# Patient Record
Sex: Female | Born: 1995 | Race: White | Hispanic: No | Marital: Single | Smoking: Never smoker
Health system: Southern US, Community
[De-identification: ages and names within clinical notes are randomized; demographics above are authoritative.]

## PROBLEM LIST (undated history)

## (undated) DIAGNOSIS — F32A Depression, unspecified: Secondary | ICD-10-CM

## (undated) DIAGNOSIS — S52332P Displaced oblique fracture of shaft of left radius, subsequent encounter for closed fracture with malunion: Secondary | ICD-10-CM

## (undated) DIAGNOSIS — S52309A Unspecified fracture of shaft of unspecified radius, initial encounter for closed fracture: Secondary | ICD-10-CM

## (undated) DIAGNOSIS — F329 Major depressive disorder, single episode, unspecified: Secondary | ICD-10-CM

## (undated) DIAGNOSIS — F909 Attention-deficit hyperactivity disorder, unspecified type: Secondary | ICD-10-CM

## (undated) DIAGNOSIS — F988 Other specified behavioral and emotional disorders with onset usually occurring in childhood and adolescence: Secondary | ICD-10-CM

---
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
## 2016-01-20 ENCOUNTER — Encounter: Payer: Self-pay | Admitting: Emergency Medicine

## 2016-01-20 ENCOUNTER — Ambulatory Visit
Admission: EM | Admit: 2016-01-20 | Discharge: 2016-01-20 | Disposition: A | Discharge status: Home / Self Care | Payer: Medicaid Other | Attending: Internal Medicine | Admitting: Internal Medicine

## 2016-01-20 ENCOUNTER — Ambulatory Visit: Payer: Medicaid Other

## 2016-01-20 DIAGNOSIS — F909 Attention-deficit hyperactivity disorder, unspecified type: Secondary | ICD-10-CM | POA: Insufficient documentation

## 2016-01-20 DIAGNOSIS — R05 Cough: Secondary | ICD-10-CM | POA: Diagnosis not present

## 2016-01-20 DIAGNOSIS — F329 Major depressive disorder, single episode, unspecified: Secondary | ICD-10-CM | POA: Insufficient documentation

## 2016-01-20 DIAGNOSIS — S99922A Unspecified injury of left foot, initial encounter: Secondary | ICD-10-CM | POA: Insufficient documentation

## 2016-01-20 DIAGNOSIS — X58XXXA Exposure to other specified factors, initial encounter: Secondary | ICD-10-CM | POA: Insufficient documentation

## 2016-01-20 DIAGNOSIS — R059 Cough, unspecified: Secondary | ICD-10-CM

## 2016-01-20 DIAGNOSIS — M79675 Pain in left toe(s): Secondary | ICD-10-CM | POA: Diagnosis present

## 2016-01-20 MED ORDER — BENZONATATE 100 MG PO CAPS
100.0000 mg | ORAL_CAPSULE | Freq: Three times a day (TID) | ORAL | Status: DC
Start: 2016-01-20 — End: 2019-04-07

## 2016-01-20 MED ORDER — AZITHROMYCIN 250 MG PO TABS
250.0000 mg | ORAL_TABLET | Freq: Every day | ORAL | Status: DC
Start: 2016-01-20 — End: 2019-04-07

## 2016-01-20 NOTE — ED Notes (Signed)
Patient states that she hurt her left 5th toe about 2 weeks ago.  Patient also reports cough and chest congestion for 2 months. Patient reports chills.

## 2016-01-20 NOTE — ED Provider Notes (Signed)
CSN: [id number]     Arrival date & time 01/20/16  1252 History   First MD Initiated Contact with Patient 01/20/16 1324     Chief Complaint  Patient presents with  . Toe Pain  . Cough   (Consider location/radiation/quality/duration/timing/severity/associated sxs/prior Treatment) HPI History obtained from patient: Location:left pinky toe   Context/Duration: Struck toe 2 weeks ago, when bug ran across floor  Severity:2   Quality:ache Timing:    constant        Home Treatment: cold packs Associated symptoms:  cough Family History: mother DM Social History: non smoker, no ETOH History obtained from patient:  Pt presents with the cc of: cough Duration of symptoms:1 month Treatment prior to arrival:[facility] and [facility] but states she is no better.  Context: cough with thick green sputum.  Other symptoms include: injury to left 5th toe Pain score:0 FAMILY HISTORY: SOCIAL HISTORY:    Past Medical History  Diagnosis Date  . ADHD (attention deficit hyperactivity disorder)     no current med.  . Radial shaft fracture 08/2012    left  . Closed displaced oblique fracture of shaft of left radius with malunion 09/04/2012  . ADD (attention deficit disorder)   . ADHD (attention deficit hyperactivity disorder)   . Depression    Past Surgical History  Procedure Laterality Date  . Orif wrist fracture  09/04/2012    Procedure: OPEN REDUCTION INTERNAL FIXATION (ORIF) WRIST FRACTURE;  Surgeon: Eulas PostJoshua P Landau, MD;  Location: [facility];  Service: Orthopedics;  Laterality: Left;  REPAIR MALUNION  LEFT RADIAL SHAFT FRACTURE   Family History  Problem Relation Age of Onset  . Diabetes Mother   . Asthma Maternal Grandmother   . Diabetes Paternal Grandmother   . Hypertension Paternal Grandmother   . Heart disease Paternal Grandmother   . Kidney disease Paternal Grandmother     kidney transplant   Social History  Substance Use Topics  . Smoking status:  Never Smoker   . Smokeless tobacco: None  . Alcohol Use: No   OB History    Gravida Para Term Preterm AB TAB SAB Ectopic Multiple Living   0 0 0 0 0 0 0 0       Review of Systems ROS +'ve toe injury, cough for a month  Denies: HEADACHE, NAUSEA, ABDOMINAL PAIN, CHEST PAIN, CONGESTION, DYSURIA, SHORTNESS OF BREATH  Allergies  Bee venom; Bee venom; Amoxapine and related; and Amoxicillin  Home Medications   Prior to Admission medications   Medication Sig Start Date End Date Taking? Authorizing Provider  brompheniramine-pseudoephedrine-DM 30-2-10 MG/5ML syrup Take 5 mLs by mouth 4 (four) times daily as needed. 11/19/15   Joni Reiningonald K Smith, PA-C  cetirizine (ZYRTEC) 10 MG chewable tablet Chew 1 tablet (10 mg total) by mouth daily. 05/22/12 06/05/12  Jimmie MollyPaolo Coll, MD  chlorhexidine (PERIDEX) 0.12 % solution Swish 3 tsp for 15 mins & spit tid prn mouth pain 11/02/14   Viviano SimasLauren Robinson, NP  guanFACINE (INTUNIV) 2 MG TB24 Take 1 tablet (2 mg total) by mouth every morning. 01/28/12   Nelly RoutArchana Kumar, MD  lisdexamfetamine (VYVANSE) 40 MG capsule Take 1 capsule (40 mg total) by mouth every morning. 01/28/12   Nelly RoutArchana Kumar, MD  mometasone (ELOCON) 0.1 % cream Apply 1 application topically daily. 10/05/13   Linna HoffJames D Kindl, MD   Meds Ordered and Administered this Visit  Medications - No data to display  BP 105/68 mmHg  Pulse 69  Temp(Src) 97.7  F (36.5 C) (Tympanic)  Resp 16  Ht  (1.702 m)  Wt 122 lb (55.339 kg)  BMI 19.10 kg/m2  SpO2 99% No data found.   Physical Exam NURSES NOTES AND VITAL SIGNS REVIEWED. CONSTITUTIONAL: Well developed, well nourished, no acute distress HEENT: normocephalic, atraumatic EYES: Conjunctiva normal NECK:normal ROM, supple, no adenopathy PULMONARY:No respiratory distress, normal effort ABDOMINAL: Soft, ND, NT BS+, No CVAT MUSCULOSKELETAL: Normal ROM of all extremities,  5th toe, left foot. No bruise, tender to palpation. No visible deformity SKIN: warm and dry  without rash PSYCHIATRIC: Mood and affect, behavior are normal  ED Course  Procedures (including critical care time)  Labs Review Labs Reviewed - No data to display  Imaging Review No results found. I HAVE PERSONALLY  REVIEWED AND DISCUSSED RESULTS OF  X-RAYS WITH PATIENT  PRIOR TO DISCHARGE.      Visual Acuity Review  Right Eye Distance:   Left Eye Distance:   Bilateral Distance:    Right Eye Near:   Left Eye Near:    Bilateral Near:         MDM   1. Toe injury, left, initial encounter   2. Cough   new old   OLD RECORDS HAVE BEEN REVIEWED.    Tharon Aquas, PA 01/20/16 1539

## 2016-01-20 NOTE — Discharge Instructions (Signed)

## 2016-04-10 ENCOUNTER — Other Ambulatory Visit: Payer: Self-pay | Admitting: Internal Medicine

## 2016-04-10 DIAGNOSIS — Q510 Agenesis and aplasia of uterus: Secondary | ICD-10-CM

## 2016-04-10 DIAGNOSIS — N910 Primary amenorrhea: Secondary | ICD-10-CM

## 2016-04-13 ENCOUNTER — Ambulatory Visit
Admission: RE | Admit: 2016-04-13 | Discharge: 2016-04-13 | Disposition: A | Discharge status: Home / Self Care | Payer: Medicaid Other | Source: Ambulatory Visit | Attending: Internal Medicine | Admitting: Internal Medicine

## 2016-04-13 DIAGNOSIS — N910 Primary amenorrhea: Secondary | ICD-10-CM | POA: Diagnosis present

## 2016-04-13 DIAGNOSIS — Q510 Agenesis and aplasia of uterus: Secondary | ICD-10-CM | POA: Diagnosis not present

## 2016-07-04 ENCOUNTER — Other Ambulatory Visit: Payer: Self-pay | Admitting: Medical Oncology

## 2016-07-04 DIAGNOSIS — R110 Nausea: Secondary | ICD-10-CM

## 2016-07-04 DIAGNOSIS — R1013 Epigastric pain: Secondary | ICD-10-CM

## 2016-07-09 ENCOUNTER — Ambulatory Visit: Payer: Medicaid Other

## 2017-04-29 ENCOUNTER — Other Ambulatory Visit: Payer: Self-pay | Admitting: Medical Oncology

## 2017-04-29 DIAGNOSIS — E041 Nontoxic single thyroid nodule: Secondary | ICD-10-CM

## 2017-05-06 ENCOUNTER — Ambulatory Visit
Admission: RE | Admit: 2017-05-06 | Discharge: 2017-05-06 | Disposition: A | Discharge status: Home / Self Care | Payer: Medicaid Other | Source: Ambulatory Visit | Attending: Medical Oncology | Admitting: Medical Oncology

## 2017-05-06 ENCOUNTER — Ambulatory Visit: Payer: Medicaid Other

## 2017-05-10 ENCOUNTER — Ambulatory Visit: Payer: Medicaid Other

## 2017-05-15 ENCOUNTER — Ambulatory Visit: Admission: RE | Admit: 2017-05-15 | Payer: Medicaid Other | Source: Ambulatory Visit

## 2017-08-28 ENCOUNTER — Emergency Department
Admission: EM | Admit: 2017-08-28 | Discharge: 2017-08-28 | Disposition: A | Discharge status: Home / Self Care | Payer: Medicaid Other | Attending: Emergency Medicine | Admitting: Emergency Medicine

## 2017-08-28 ENCOUNTER — Encounter: Payer: Self-pay | Admitting: Emergency Medicine

## 2017-08-28 ENCOUNTER — Emergency Department: Payer: Medicaid Other

## 2017-08-28 ENCOUNTER — Other Ambulatory Visit: Payer: Self-pay

## 2017-08-28 DIAGNOSIS — Z79899 Other long term (current) drug therapy: Secondary | ICD-10-CM | POA: Diagnosis not present

## 2017-08-28 DIAGNOSIS — M545 Low back pain, unspecified: Secondary | ICD-10-CM

## 2017-08-28 DIAGNOSIS — M542 Cervicalgia: Secondary | ICD-10-CM | POA: Insufficient documentation

## 2017-08-28 MED ORDER — DIAZEPAM 2 MG PO TABS: 2.0000 mg | ORAL_TABLET | Freq: Four times a day (QID) | ORAL | 0 refills | Status: DC | PRN

## 2017-08-28 MED ORDER — ETODOLAC 200 MG PO CAPS: 200.0000 mg | ORAL_CAPSULE | Freq: Three times a day (TID) | ORAL | 0 refills | Status: DC

## 2017-08-28 MED ORDER — KETOROLAC TROMETHAMINE 60 MG/2ML IM SOLN
60.0000 mg | Freq: Once | INTRAMUSCULAR | Status: DC
  Filled 2017-08-28: qty 2

## 2017-08-28 MED ORDER — LIDOCAINE 5 % EX PTCH: 1.0000 | MEDICATED_PATCH | Freq: Two times a day (BID) | CUTANEOUS | 0 refills | Status: AC

## 2017-08-28 MED ORDER — OXYCODONE-ACETAMINOPHEN 5-325 MG PO TABS
2.0000 | ORAL_TABLET | Freq: Once | ORAL | Status: AC
  Administered 2017-08-28: 2 via ORAL
  Filled 2017-08-28: qty 2
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
## 2017-08-28 NOTE — ED Notes (Signed)
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 MD notified.

## 2017-08-28 NOTE — Discharge Instructions (Signed)
Please follow up with your primary care physician.

## 2017-08-28 NOTE — ED Notes (Signed)
Patient transported to X-ray 
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
## 2017-08-28 NOTE — ED Notes (Signed)
Pt went to Imaging.

## 2019-04-07 ENCOUNTER — Emergency Department
Admission: EM | Admit: 2019-04-07 | Discharge: 2019-04-07 | Disposition: A | Discharge status: Home / Self Care | Payer: HRSA Program | Attending: Emergency Medicine | Admitting: Emergency Medicine

## 2019-04-07 ENCOUNTER — Other Ambulatory Visit: Payer: Self-pay

## 2019-04-07 ENCOUNTER — Encounter: Payer: Self-pay | Admitting: Emergency Medicine

## 2019-04-07 DIAGNOSIS — R110 Nausea: Secondary | ICD-10-CM | POA: Diagnosis not present

## 2019-04-07 DIAGNOSIS — Z20828 Contact with and (suspected) exposure to other viral communicable diseases: Secondary | ICD-10-CM | POA: Diagnosis not present

## 2019-04-07 DIAGNOSIS — R0981 Nasal congestion: Secondary | ICD-10-CM | POA: Insufficient documentation

## 2019-04-07 DIAGNOSIS — M791 Myalgia, unspecified site: Secondary | ICD-10-CM | POA: Insufficient documentation

## 2019-04-07 DIAGNOSIS — R05 Cough: Secondary | ICD-10-CM | POA: Insufficient documentation

## 2019-04-07 DIAGNOSIS — R51 Headache: Secondary | ICD-10-CM | POA: Diagnosis present

## 2019-04-07 DIAGNOSIS — Z20822 Contact with and (suspected) exposure to covid-19: Secondary | ICD-10-CM

## 2019-04-07 DIAGNOSIS — A64 Unspecified sexually transmitted disease: Secondary | ICD-10-CM | POA: Diagnosis not present

## 2019-04-07 LAB — CHLAMYDIA/NGC RT PCR (ARMC ONLY): N gonorrhoeae: NOT DETECTED

## 2019-04-07 LAB — WET PREP, GENITAL
Clue Cells Wet Prep HPF POC: NONE SEEN
Sperm: NONE SEEN
Trich, Wet Prep: NONE SEEN
Yeast Wet Prep HPF POC: NONE SEEN

## 2019-04-07 LAB — CHLAMYDIA/NGC RT PCR (ARMC ONLY)??????????: Chlamydia Tr: NOT DETECTED

## 2019-04-07 MED ORDER — AZITHROMYCIN 500 MG PO TABS
1000.0000 mg | ORAL_TABLET | Freq: Once | ORAL | Status: AC
  Administered 2019-04-07: 16:00:00 1000 mg via ORAL
  Filled 2019-04-07: qty 2

## 2019-04-07 MED ORDER — CEFTRIAXONE SODIUM 250 MG IJ SOLR
250.0000 mg | Freq: Once | INTRAMUSCULAR | Status: AC
  Administered 2019-04-07: 250 mg via INTRAMUSCULAR
  Filled 2019-04-07: qty 250

## 2019-04-07 NOTE — ED Notes (Signed)
See triage note  States she has has h/a and cough for about 2 days  Denies any fever  But has had some nausea  Afebrile on arrival   Also states she was treated for possible STD  But states conts to have some discharge and vaginal itching

## 2019-04-07 NOTE — ED Provider Notes (Signed)
[facility] Emergency Department Provider Note  ____________________________________________  Time seen: Approximately 3:34 PM  I have reviewed the triage vital signs and the nursing notes.   HISTORY  Chief Complaint No chief complaint on file.    HPI Jillian Ross is a 23 y.o. female who presents the emergency department for 2 complaints.  Patient's primary complaint is requesting COVID testing as she has had headaches, nasal congestion, cough, nausea, body aches for 3 to 4 days.  She denies any fever, shortness of breath, emesis, diarrhea or constipation.  Patient reports that she lives next to her grandmother and is very concerned given the close contact for any transmission to her elderly relative.  Patient reports that when symptoms began she has self quarantine.  She does not know any specific contact with a positive COVID-19 person.  Patient is also requesting STD testing.  Patient reports that she was treated by her primary care 2 weeks prior for a chlamydial infection.  According to the patient, she had been placed on doxycycline, Diflucan.  Patient is still symptomatic this time.  She denies any dysuria or polyuria.  No hematuria.        Past Medical History:  Diagnosis Date  . ADD (attention deficit disorder)   . ADHD (attention deficit hyperactivity disorder)    no current med.  . ADHD (attention deficit hyperactivity disorder)   . Closed displaced oblique fracture of shaft of left radius with malunion 09/04/2012  . Depression   . Radial shaft fracture 08/2012   left    Patient Active Problem List   Diagnosis Date Noted  . Closed displaced oblique fracture of shaft of left radius with malunion 09/04/2012  . ADHD (attention deficit hyperactivity disorder), combined type 08/30/2011  . ODD (oppositional defiant disorder) 08/30/2011    Past Surgical History:  Procedure Laterality Date  . ORIF WRIST FRACTURE  09/04/2012   Procedure:  OPEN REDUCTION INTERNAL FIXATION (ORIF) WRIST FRACTURE;  Surgeon: Johnny Bridge, MD;  Location: [facility];  Service: Orthopedics;  Laterality: Left;  REPAIR MALUNION  LEFT RADIAL SHAFT FRACTURE    Prior to Admission medications   Medication Sig Start Date End Date Taking? Authorizing Provider  cetirizine (ZYRTEC) 10 MG chewable tablet Chew 1 tablet (10 mg total) by mouth daily. 05/22/12 06/05/12  Rosana Hoes, MD  guanFACINE (INTUNIV) 2 MG TB24 Take 1 tablet (2 mg total) by mouth every morning. 01/28/12   Hampton Abbot, MD  lisdexamfetamine (VYVANSE) 40 MG capsule Take 1 capsule (40 mg total) by mouth every morning. 01/28/12   Hampton Abbot, MD  mometasone (ELOCON) 0.1 % cream Apply 1 application topically daily. 10/05/13   Billy Fischer, MD    Allergies Bee venom, Bee venom, Amoxapine and related, and Amoxicillin  Family History  Problem Relation Age of Onset  . Diabetes Mother   . Asthma Maternal Grandmother   . Diabetes Paternal Grandmother   . Hypertension Paternal Grandmother   . Heart disease Paternal Grandmother   . Kidney disease Paternal Grandmother        kidney transplant    Social History Social History   Tobacco Use  . Smoking status: Never Smoker  . Smokeless tobacco: Never Used  Substance Use Topics  . Alcohol use: Yes  . Drug use: No     Review of Systems  Constitutional: No fever/chills.  Positive for body aches. Eyes: No visual changes. No discharge ENT: N positive for nasal congestion. Cardiovascular: no chest  pain. Respiratory: Positive cough. No SOB. Gastrointestinal: No abdominal pain.  Positive no vomiting.  No diarrhea.  No constipation. Genitourinary: Negative for dysuria. No hematuria.  Positive for vaginal discharge.  Recently treated for chlamydia Musculoskeletal: Negative for musculoskeletal pain. Skin: Negative for rash, abrasions, lacerations, ecchymosis. Neurological: Negative for headaches, focal weakness or  numbness. 10-point ROS otherwise negative.  ____________________________________________   PHYSICAL EXAM:  VITAL SIGNS: ED Triage Vitals  Enc Vitals Group     BP 04/07/19 1512 110/71     Pulse Rate 04/07/19 1512 70     Resp 04/07/19 1512 16     Temp 04/07/19 1512 98.3 F (36.8 C)     Temp Source 04/07/19 1512 Oral     SpO2 04/07/19 1512 100 %     Weight 04/07/19 1509 119 lb 14.9 oz (54.4 kg)     Height --      Head Circumference --      Peak Flow --      Pain Score 04/07/19 1509 0     Pain Loc --      Pain Edu? --      Excl. in GC? --      Constitutional: Alert and oriented. Well appearing and in no acute distress. Eyes: Conjunctivae are normal. PERRL. EOMI. Head: Atraumatic. ENT:      Ears:       Nose: No congestion/rhinnorhea.      Mouth/Throat: Mucous membranes are moist.  Oropharynx is nonerythematous and nonedematous.  Use midline. Neck: No stridor.  Neck is supple full range of motion Hematological/Lymphatic/Immunilogical: No cervical lymphadenopathy. Cardiovascular: Normal rate, regular rhythm. Normal S1 and S2.  Good peripheral circulation. Respiratory: Normal respiratory effort without tachypnea or retractions. Lungs CTAB. Good air entry to the bases with no decreased or absent breath sounds. Gastrointestinal: Bowel sounds 4 quadrants. Soft and nontender to palpation. No guarding or rigidity. No palpable masses. No distention. No CVA tenderness. Genitourinary: Visualization of the external genitalia reveal no abnormal findings.  Pelvic exam performed with no signs of vaginal lacerations or tears.  Patient does have milky white discharge in the vaginal vault.  Cervix is unremarkable.  Bimanual exam reveals no cervical motion tenderness.  No palpable masses or lesions in bilateral adnexa. Musculoskeletal: Full range of motion to all extremities. No gross deformities appreciated. Neurologic:  Normal speech and language. No gross focal neurologic deficits are  appreciated.  Skin:  Skin is warm, dry and intact. No rash noted. Psychiatric: Mood and affect are normal. Speech and behavior are normal. Patient exhibits appropriate insight and judgement.  Pelvic exam chaperoned by female RN. ____________________________________________   LABS (all labs ordered are listed, but only abnormal results are displayed)  Labs Reviewed  WET PREP, GENITAL  CHLAMYDIA/NGC RT PCR ([facility] ONLY)  NOVEL CORONAVIRUS, NAA (HOSPITAL ORDER, SEND-OUT TO REF LAB)  POC URINE PREG, ED   ____________________________________________  EKG   ____________________________________________  RADIOLOGY   No results found.  ____________________________________________    PROCEDURES  Procedure(s) performed:    Procedures    Medications  cefTRIAXone (ROCEPHIN) injection 250 mg (has no administration in time range)  azithromycin (ZITHROMAX) tablet 1,000 mg (has no administration in time range)     ____________________________________________   INITIAL IMPRESSION / ASSESSMENT AND PLAN / ED COURSE  Pertinent labs & imaging results that were available during my care of the patient were reviewed by me and considered in my medical decision making (see chart for details).  Review of the [place] CSRS was  performed in accordance of the [facility] prior to dispensing any controlled drugs.           Patient's diagnosis is consistent with COVID-19-like illness, exposure to STD.  Patient presented to emergency department with 2 complaints.  Patient has mild symptoms concerning for COVID-19.  She is concerned as she lives next to her elderly grandmother.  Patient will have testing performed at this time.  This is a send out test and she will receive results if test returns positive.  Patient is also concerned that she continues to have STD symptoms.  Patient was treated for chlamydia with doxycycline.  Patient is still symptomatic at this time.  Patient will receive  azithromycin and Rocephin injection in the emergency department.  Patient does have milky vaginal discharge at this time.  Labs are still pending, but again patient is treated empirically..  Patient may call the emergency department for results later.  Patient is given instructions regarding COVID-19 and self quarantine.  No medications prescribed at this time.  Patient is given ED precautions to return to the ED for any worsening or new symptoms.     ____________________________________________  FINAL CLINICAL IMPRESSION(S) / ED DIAGNOSES  Final diagnoses:  Suspected Covid-19 Virus Infection  STD (female)      NEW MEDICATIONS STARTED DURING THIS VISIT:  ED Discharge Orders    None          This chart was dictated using voice recognition software/Dragon. Despite best efforts to proofread, errors can occur which can change the meaning. Any change was purely unintentional.    Racheal PatchesCuthriell, Cynthia Stainback D, PA-C 04/07/19 1615    Phineas SemenGoodman, Graydon, MD 04/07/19 [phone]

## 2019-04-07 NOTE — ED Triage Notes (Signed)
C/O headache and cough for the past few days.  Denies fevers.  Patient states she lives near her grandmother and is worried she has COVID.

## 2019-04-08 LAB — NOVEL CORONAVIRUS, NAA (HOSP ORDER, SEND-OUT TO REF LAB; TAT 18-24 HRS): SARS-CoV-2, NAA: NOT DETECTED

## 2019-08-21 ENCOUNTER — Emergency Department (HOSPITAL_COMMUNITY): Payer: Medicaid Other

## 2019-08-21 ENCOUNTER — Encounter (HOSPITAL_COMMUNITY): Payer: Self-pay | Admitting: *Deleted

## 2019-08-21 ENCOUNTER — Emergency Department (HOSPITAL_COMMUNITY)
Admission: EM | Admit: 2019-08-21 | Discharge: 2019-08-21 | Disposition: A | Discharge status: Home / Self Care | Payer: Medicaid Other | Attending: Emergency Medicine | Admitting: Emergency Medicine

## 2019-08-21 ENCOUNTER — Other Ambulatory Visit: Payer: Self-pay

## 2019-08-21 DIAGNOSIS — R519 Headache, unspecified: Secondary | ICD-10-CM | POA: Insufficient documentation

## 2019-08-21 DIAGNOSIS — R0789 Other chest pain: Secondary | ICD-10-CM | POA: Insufficient documentation

## 2019-08-21 DIAGNOSIS — Z79899 Other long term (current) drug therapy: Secondary | ICD-10-CM | POA: Insufficient documentation

## 2019-08-21 DIAGNOSIS — R040 Epistaxis: Secondary | ICD-10-CM | POA: Insufficient documentation

## 2019-08-21 DIAGNOSIS — R55 Syncope and collapse: Secondary | ICD-10-CM | POA: Insufficient documentation

## 2019-08-21 LAB — CBG MONITORING, ED: Glucose-Capillary: 84 mg/dL (ref 70–99)

## 2019-08-21 LAB — BASIC METABOLIC PANEL
Anion gap: 7 (ref 5–15)
BUN: 14 mg/dL (ref 6–20)
CO2: 25 mmol/L (ref 22–32)
Calcium: 8.3 mg/dL — ABNORMAL LOW (ref 8.9–10.3)
Chloride: 107 mmol/L (ref 98–111)
Creatinine, Ser: 0.94 mg/dL (ref 0.44–1.00)
GFR calc Af Amer: >60 mL/min (ref >60)
GFR calc non Af Amer: >60 mL/min (ref >60)
Glucose, Bld: 161 mg/dL — ABNORMAL HIGH (ref 70–99)
Potassium: 3.4 mmol/L — ABNORMAL LOW (ref 3.5–5.1)
Sodium: 139 mmol/L (ref 135–145)

## 2019-08-21 LAB — CBC
HCT: 36.3 % (ref 36.0–46.0)
Hemoglobin: 12.3 g/dL (ref 12.0–15.0)
MCH: 34.7 pg — ABNORMAL HIGH (ref 26.0–34.0)
MCHC: 33.9 g/dL (ref 30.0–36.0)
MCV: 102.5 fL — ABNORMAL HIGH (ref 80.0–100.0)
Platelets: 164 10*3/uL (ref 150–400)
RBC: 3.54 MIL/uL — ABNORMAL LOW (ref 3.87–5.11)
RDW: 11.9 % (ref 11.5–15.5)
WBC: 7.6 10*3/uL (ref 4.0–10.5)
nRBC: 0 % (ref 0.0–0.2)

## 2019-08-21 LAB — I-STAT BETA HCG BLOOD, ED (MC, WL, AP ONLY): I-stat hCG, quantitative: <5 m[IU]/mL (ref <5)

## 2019-08-21 MED ORDER — SODIUM CHLORIDE 0.9% FLUSH: 3.0000 mL | Freq: Once | INTRAVENOUS | Status: DC

## 2019-08-21 MED ORDER — POTASSIUM CHLORIDE CRYS ER 20 MEQ PO TBCR: 20.0000 meq | EXTENDED_RELEASE_TABLET | Freq: Every day | ORAL | 0 refills | Status: AC

## 2019-08-21 NOTE — ED Notes (Signed)
Pt discharged by previous RN not removed from system

## 2019-08-21 NOTE — ED Notes (Signed)
Pt requested to be discharged, PA argeed and pt given d/c instructions including some results were not back. Pt escorted out with S.O

## 2019-08-21 NOTE — ED Provider Notes (Signed)
[facility] EMERGENCY DEPARTMENT Provider Note   CSN: [id number] Arrival date & time: 08/21/19  0867     History Chief Complaint  Patient presents with  . Near Syncope    Jillian Ross is a 23 y.o. female with a history of ADHD who presents to the ED s/p syncopal episode which occurred just PTA this AM. Patient states shortly after she woke up she got out of bed to go to the bathroom and as she was ambulating she began to feel lightheaded & passed out. She states she struck her face on something but is unsure what. She was only out for a few seconds and as she woke back up she felt a bit nauseous with pain to the teeth & nose with epistaxis which she thinks was coming from both nostrils. Currently bleeding has stopped, she is having pain primarily to the nose and also mildly to the chest- states pain did not start until after fall with syncope. No alleviating/aggravating factors. No prior history of syncope. Denies chest pain/dyspena/palpitations prior to syncope. Denies visual disturbance, numbness, weakness, vomiting, seizure activity, fever, dyspnea, abdominal pain, neck pain, or back pain. She is not on blood thinners.   HPI     Past Medical History:  Diagnosis Date  . ADD (attention deficit disorder)   . ADHD (attention deficit hyperactivity disorder)    no current med.  . ADHD (attention deficit hyperactivity disorder)   . Closed displaced oblique fracture of shaft of left radius with malunion 09/04/2012  . Depression   . Radial shaft fracture 08/2012   left    Patient Active Problem List   Diagnosis Date Noted  . Closed displaced oblique fracture of shaft of left radius with malunion 09/04/2012  . ADHD (attention deficit hyperactivity disorder), combined type 08/30/2011  . ODD (oppositional defiant disorder) 08/30/2011    Past Surgical History:  Procedure Laterality Date  . ORIF WRIST FRACTURE  09/04/2012   Procedure: OPEN REDUCTION INTERNAL  FIXATION (ORIF) WRIST FRACTURE;  Surgeon: Johnny Bridge, MD;  Location: [place];  Service: Orthopedics;  Laterality: Left;  REPAIR MALUNION  LEFT RADIAL SHAFT FRACTURE     OB History    Gravida  0   Para  0   Term  0   Preterm  0   AB  0   Living        SAB  0   TAB  0   Ectopic  0   Multiple      Live Births              Family History  Problem Relation Age of Onset  . Diabetes Mother   . Asthma Maternal Grandmother   . Diabetes Paternal Grandmother   . Hypertension Paternal Grandmother   . Heart disease Paternal Grandmother   . Kidney disease Paternal Grandmother        kidney transplant    Social History   Tobacco Use  . Smoking status: Never Smoker  . Smokeless tobacco: Never Used  Substance Use Topics  . Alcohol use: Yes  . Drug use: No    Home Medications Prior to Admission medications   Medication Sig Start Date End Date Taking? Authorizing Provider  cetirizine (ZYRTEC) 10 MG chewable tablet Chew 1 tablet (10 mg total) by mouth daily. 05/22/12 06/05/12  Rosana Hoes, MD  guanFACINE (INTUNIV) 2 MG TB24 Take 1 tablet (2 mg total) by mouth every morning. 01/28/12   Dwyane Dee,  Ovidio Kin, MD  lisdexamfetamine (VYVANSE) 40 MG capsule Take 1 capsule (40 mg total) by mouth every morning. 01/28/12   Nelly Rout, MD  mometasone (ELOCON) 0.1 % cream Apply 1 application topically daily. 10/05/13   Linna Hoff, MD    Allergies    Bee venom, Bee venom, Amoxapine and related, and Amoxicillin  Review of Systems   Review of Systems  Constitutional: Negative for chills and fever.  HENT: Positive for nosebleeds (resolved @ present).        Positive for nasal & dental pain.   Eyes: Negative for visual disturbance.  Respiratory: Negative for shortness of breath.   Cardiovascular: Positive for chest pain (s/p fall/syncope).  Gastrointestinal: Positive for nausea. Negative for abdominal pain, constipation, diarrhea and vomiting.  Genitourinary:  Negative for dysuria.  Musculoskeletal: Negative for back pain and neck pain.  Neurological: Positive for syncope and light-headedness (resolved@ present). Negative for seizures, facial asymmetry, weakness, numbness and headaches.  All other systems reviewed and are negative.   Physical Exam Updated Vital Signs BP 114/72 (BP Location: Right Arm)   Pulse 63   Temp 97.9 F (36.6 C) (Oral)   Resp 16   SpO2 100%   Physical Exam Vitals and nursing note reviewed.  Constitutional:      General: She is not in acute distress.    Appearance: She is well-developed.  HENT:     Head: Normocephalic. No raccoon eyes or Battle's sign.     Comments: Other than nose no facial bony tenderness.     Right Ear: No hemotympanum.     Left Ear: No hemotympanum.     Nose:     Comments: No active epistaxis, blood present in L nare.  No septal hematoma.  No significant septal deviation.  Tenderness to palpation to the nose with mild swelling present.     Mouth/Throat:     Pharynx: Uvula midline. No oropharyngeal exudate or posterior oropharyngeal erythema.     Comments: No palpable instability or tenderness to dentition.No intra-oral wounds/bleeding.  Posterior oropharynx is symmetric appearing. Patient tolerating own secretions without difficulty. No trismus. No drooling. No hot potato voice. No swelling beneath the tongue, submandibular compartment is soft.  Eyes:     General:        Right eye: No discharge.        Left eye: No discharge.     Extraocular Movements: Extraocular movements intact.     Conjunctiva/sclera: Conjunctivae normal.     Pupils: Pupils are equal, round, and reactive to light.  Cardiovascular:     Rate and Rhythm: Normal rate and regular rhythm.     Heart sounds: No murmur.  Pulmonary:     Effort: No respiratory distress.     Breath sounds: Normal breath sounds. No wheezing or rales.  Chest:     Chest wall: Tenderness (anterior chest wall to the sternum, no overlying skin  changes or palpable crepitus) present.  Abdominal:     General: There is no distension.     Palpations: Abdomen is soft.     Tenderness: There is no abdominal tenderness.  Musculoskeletal:     Cervical back: Normal range of motion and neck supple. No spinous process tenderness.     Comments: UE/LEs: Intact AROM, no point/focal bony tenderness. R ankle anterior well healed scar present consistent with prior surgery.  No midline spinal tenderness. No palpable step off.   Skin:    General: Skin is warm and dry.  Findings: No rash.  Psychiatric:        Behavior: Behavior normal.     ED Results / Procedures / Treatments   Labs (all labs ordered are listed, but only abnormal results are displayed) Labs Reviewed  BASIC METABOLIC PANEL - Abnormal; Notable for the following components:      Result Value   Potassium 3.4 (*)    Glucose, Bld 161 (*)    Calcium 8.3 (*)    All other components within normal limits  CBC - Abnormal; Notable for the following components:   RBC 3.54 (*)    MCV 102.5 (*)    MCH 34.7 (*)    All other components within normal limits  URINALYSIS, ROUTINE W REFLEX MICROSCOPIC  CBG MONITORING, ED  I-STAT BETA HCG BLOOD, ED ([facility], [facility], [facility] ONLY)    EKG EKG Interpretation  Date/Time:  Friday August 21 2019 08:28:17 EST Ventricular Rate:  55 PR Interval:  134 QRS Duration: 94 QT Interval:  414 QTC Calculation: 396 R Axis:   86 Text Interpretation: Sinus bradycardia with sinus arrhythmia RSR' or QR pattern in V1 suggests right ventricular conduction delay Nonspecific T wave abnormality Abnormal ECG No previous ECGs available Confirmed by Frederick Peers [id number]) on 08/21/2019 9:06:56 AM   Radiology No results found.  Procedures Procedures (including critical care time)  Medications Ordered in ED Medications  sodium chloride flush (NS) 0.9 % injection 3 mL (has no administration in time range)    ED Course/MDM  I have reviewed the triage vital signs  and the nursing notes.  Pertinent labs & imaging results that were available during my care of the patient were reviewed by me and considered in my medical decision making (see chart for details).  Patient presents to the ED s/p syncope episode with complaints of epistaxis which is resolved and nasal/dental/chest pain s/p injury. Patient is nontoxic appearing, no apparent distress, vitals WNL. No midline spinal tenderness or focal neuro deficits- do not suspect head bleed or spinal fracture. She does have focal tenderness to the nasal bones without active bleeding therefore nasal bone xray was ordered & pending, no other facial bony tenderness or palpable dental instability to indicate significant facial fracture. She has anterior chest wall tenderness to palpation, CXR negative, no overlying skin changes, hypoxia, respiratory distress, or hemoptysis to raise concern for more significant intrathoracic trauma. No abdominal tenderness. Labs reviewed: CBC without anemia, BMP with mild hypokalemia & mild hypocalcemia discussed diet recommendations & kdur prescription, hyperglycemia will need PCP recheck. Preg test negative- doubt ectopic. EKG & cardiac monitor without significant arrhythmia, STEMI, or substantial abnormality in intervals. Patient requesting to leave the ER prior to nasal bone xray results, urine sample or orthostatics. She is tolerating PO, vitals remain stable, she is ambulatory without difficulty. No septal hematoma, obvious deformity, or active bleeding from nose- will discharge with precautionary nasal fracture instructions (no nose blowing, ice, ENT follow up) as well as instructions for PCP follow up and good oral hydration. I discussed results, treatment plan, need for follow-up, and return precautions with the patient. Provided opportunity for questions, patient confirmed understanding and is in agreement with plan.   Findings and plan of care discussed with supervising physician Dr.  Clarene Duke who is in agreement.    Final Clinical Impression(s) / ED Diagnoses Final diagnoses:  Syncope, unspecified syncope type    Rx / DC Orders ED Discharge Orders         Ordered    potassium chloride SA (  KLOR-CON) 20 MEQ tablet  Daily     08/21/19 1023         Following discharge- nasal bone xray returned negative for fx.     Cherly Andersonetrucelli, Ryliee Figge R, PA-C 08/21/19 1034    Little, Ambrose Finlandachel Morgan, MD 08/21/19 [phone]

## 2019-08-21 NOTE — ED Triage Notes (Signed)
Patient states she was going to the bathroom this am and fell doesn't remember what happened. States she hit her nose and mouth on the floor. No loose teeth. Blood coming from nose. C/o pain in her face. Currently alert oriented. Pale.

## 2019-08-21 NOTE — ED Notes (Signed)
Patient transported to x-ray. ?

## 2019-08-21 NOTE — Discharge Instructions (Signed)
You were seen in the emergency department today after passing out. Your labs show that your potassium was mildly low, we are sending you home with a few days worth of potassium tablets to help with this, please also see attached diet guidelines. Your calcium was a bit low- please see diet recommendations. Your blood sugar was noted to be elevated at 161, please have this rechecked by your primary care provider within 1 week.  We have prescribed you new medication(s) today. Discuss the medications prescribed today with your pharmacist as they can have adverse effects and interactions with your other medicines including over the counter and prescribed medications. Seek medical evaluation if you start to experience new or abnormal symptoms after taking one of these medicines, seek care immediately if you start to experience difficulty breathing, feeling of your throat closing, facial swelling, or rash as these could be indications of a more serious allergic reaction  Your chest x-ray was normal. Your nose x-ray had not resulted at the time you asked to leave the emergency department. Given the possibility that this could be broken and we recommend that you do not blow your nose at all, apply ice wrapped in a towel 20 minutes on 40 minutes off to your nasal area for the next 48 hours, you may use nasal saline sprays, and please follow-up with the ear nose and throat doctor provided in your discharge instructions within 3 days.  Please take Tylenol/Motrin per over-the-counter dosing to help with discomfort.  Please be sure to stay well-hydrated.  Please follow-up with your primary care provider within 1 week regarding today's event. Return to the ER for new or worsening symptoms including but not limited to recurrence of passing out, seizure activity, inability to keep fluids down, worsening pain, uncontrollable nosebleeding, or any other concerns.

## 2020-05-10 ENCOUNTER — Ambulatory Visit (LOCAL_COMMUNITY_HEALTH_CENTER): Payer: Self-pay

## 2020-05-10 ENCOUNTER — Other Ambulatory Visit: Payer: Self-pay

## 2020-05-10 DIAGNOSIS — Z111 Encounter for screening for respiratory tuberculosis: Secondary | ICD-10-CM

## 2020-05-13 ENCOUNTER — Other Ambulatory Visit: Payer: Self-pay

## 2020-05-13 ENCOUNTER — Ambulatory Visit (LOCAL_COMMUNITY_HEALTH_CENTER): Payer: Medicaid Other

## 2020-05-13 DIAGNOSIS — Z111 Encounter for screening for respiratory tuberculosis: Secondary | ICD-10-CM

## 2020-05-13 LAB — TB SKIN TEST
Induration: 0 mm
TB Skin Test: NEGATIVE

## 2021-02-19 IMAGING — CR DG CHEST 2V
2 series · 2 of 2 positions shown · non-contrast
Comparison: Chest radiograph 08/28/2017

CLINICAL DATA: Injury. Additional history provided: Patient reports
that she fainted and fell and think she hit her face.

EXAM:
CHEST - 2 VIEW

[chest lat]
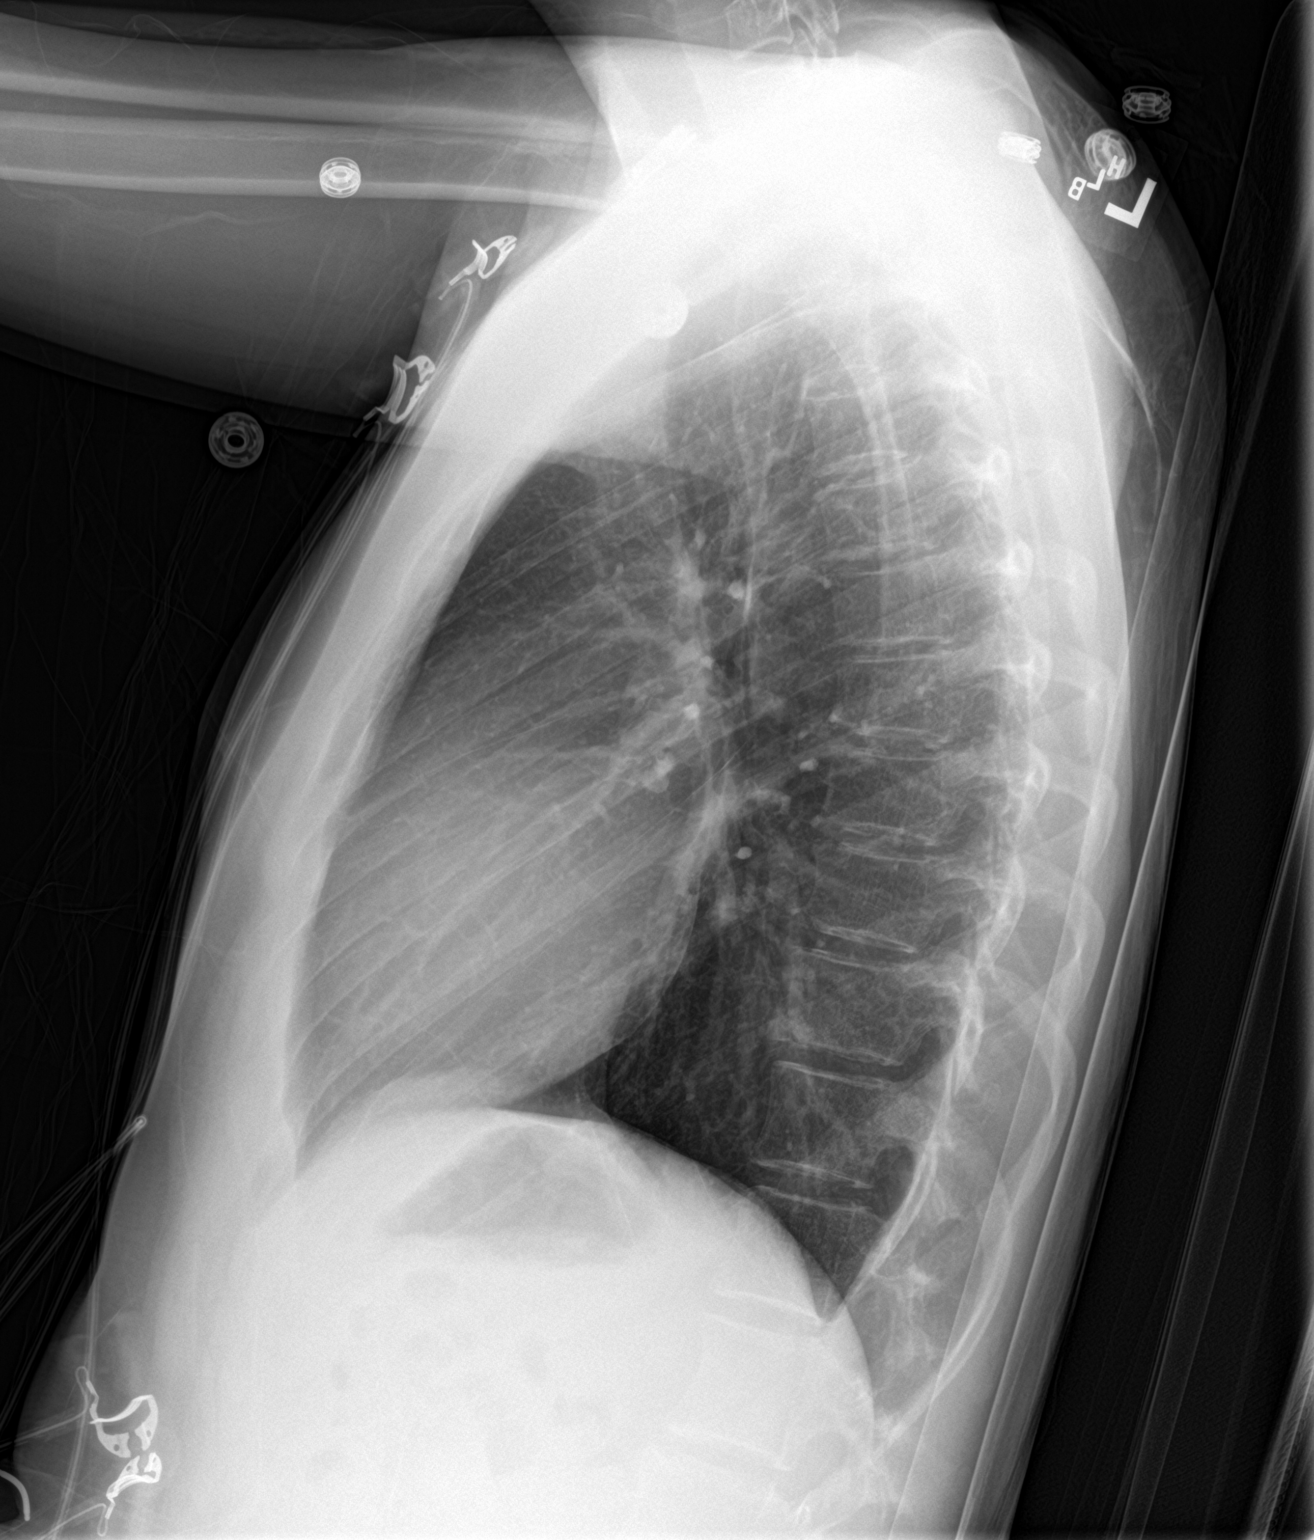

[chest ap]
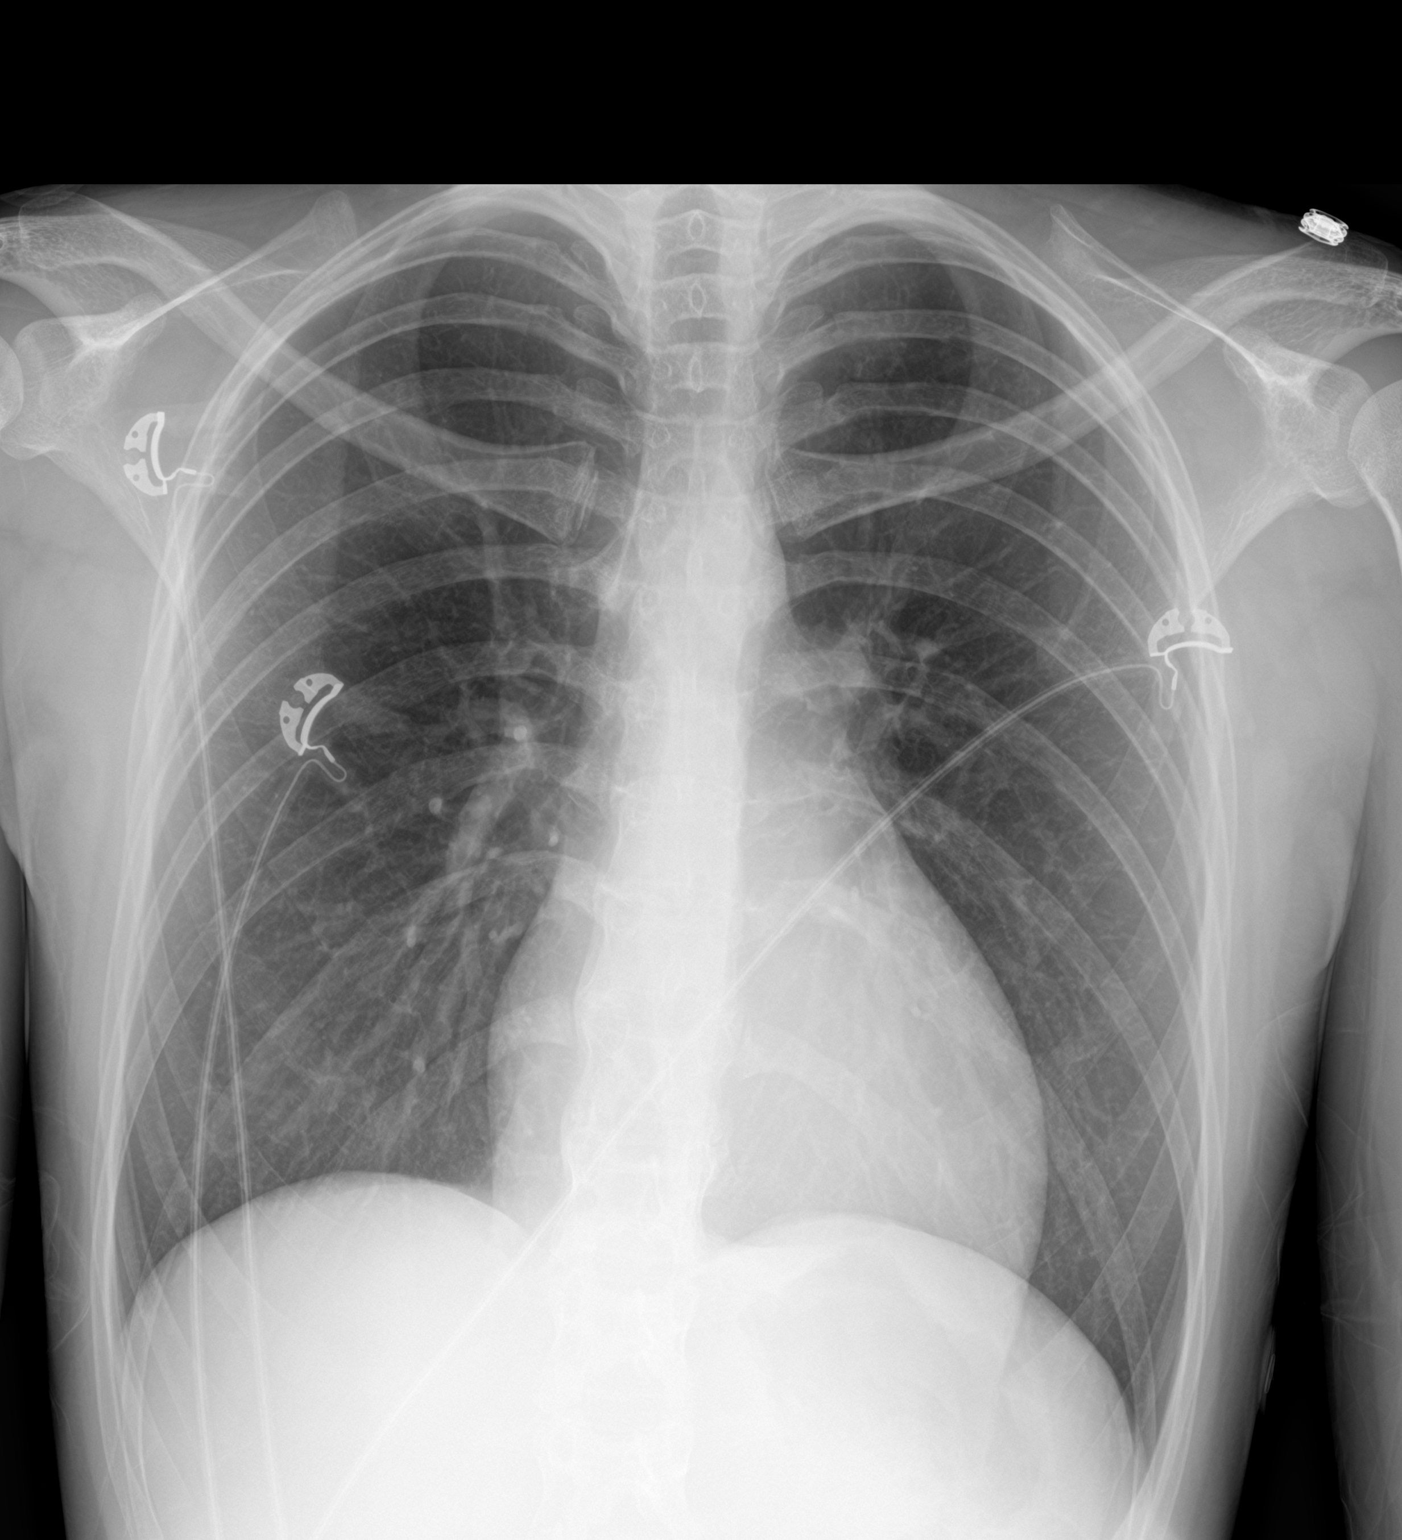

[2 of 2 positions shown; findings below may reference images not displayed]

FINDINGS: Heart size within normal limits.

There is no airspace consolidation within the lungs.

No evidence of pleural effusion or pneumothorax.

No displaced fracture is identified.
IMPRESSION: No evidence of acute cardiopulmonary abnormality.
